# Patient Record
Sex: Female | Born: 1965 | Race: White | Hispanic: No | Marital: Married | Smoking: Former smoker
Health system: Southern US, Community
[De-identification: ages and names within clinical notes are randomized; demographics above are authoritative.]

## PROBLEM LIST (undated history)

## (undated) DIAGNOSIS — Z808 Family history of malignant neoplasm of other organs or systems: Secondary | ICD-10-CM

## (undated) DIAGNOSIS — E785 Hyperlipidemia, unspecified: Secondary | ICD-10-CM

## (undated) DIAGNOSIS — E041 Nontoxic single thyroid nodule: Secondary | ICD-10-CM

## (undated) DIAGNOSIS — Z85828 Personal history of other malignant neoplasm of skin: Secondary | ICD-10-CM

## (undated) DIAGNOSIS — M1990 Unspecified osteoarthritis, unspecified site: Secondary | ICD-10-CM

## (undated) HISTORY — DX: Nontoxic single thyroid nodule: E04.1

## (undated) HISTORY — DX: Family history of malignant neoplasm of other organs or systems: Z80.8

## (undated) HISTORY — DX: Personal history of other malignant neoplasm of skin: Z85.828

## (undated) HISTORY — PX: TONSILLECTOMY AND ADENOIDECTOMY: SHX28

## (undated) HISTORY — DX: Hyperlipidemia, unspecified: E78.5

## (undated) HISTORY — DX: Unspecified osteoarthritis, unspecified site: M19.90

---

## 2007-01-24 ENCOUNTER — Ambulatory Visit (HOSPITAL_COMMUNITY): Admission: RE | Admit: 2007-01-24 | Discharge: 2007-01-24 | Payer: Self-pay | Admitting: Dermatology

## 2008-01-20 IMAGING — CR DG CHEST 2V
2 series · 2 of 2 positions shown · non-contrast
Comparison: none

HISTORY: Malignant melanoma, former smoker

CHEST 2 VIEWS:
No prior exams for comparison.
Normal heart size, mediastinal contours, and pulmonary vascularity.
Minimal peribronchial thickening.
No infiltrate, effusion, or pulmonary mass/nodule.
Bones unremarkable.

[view not recorded (1 of 2)]
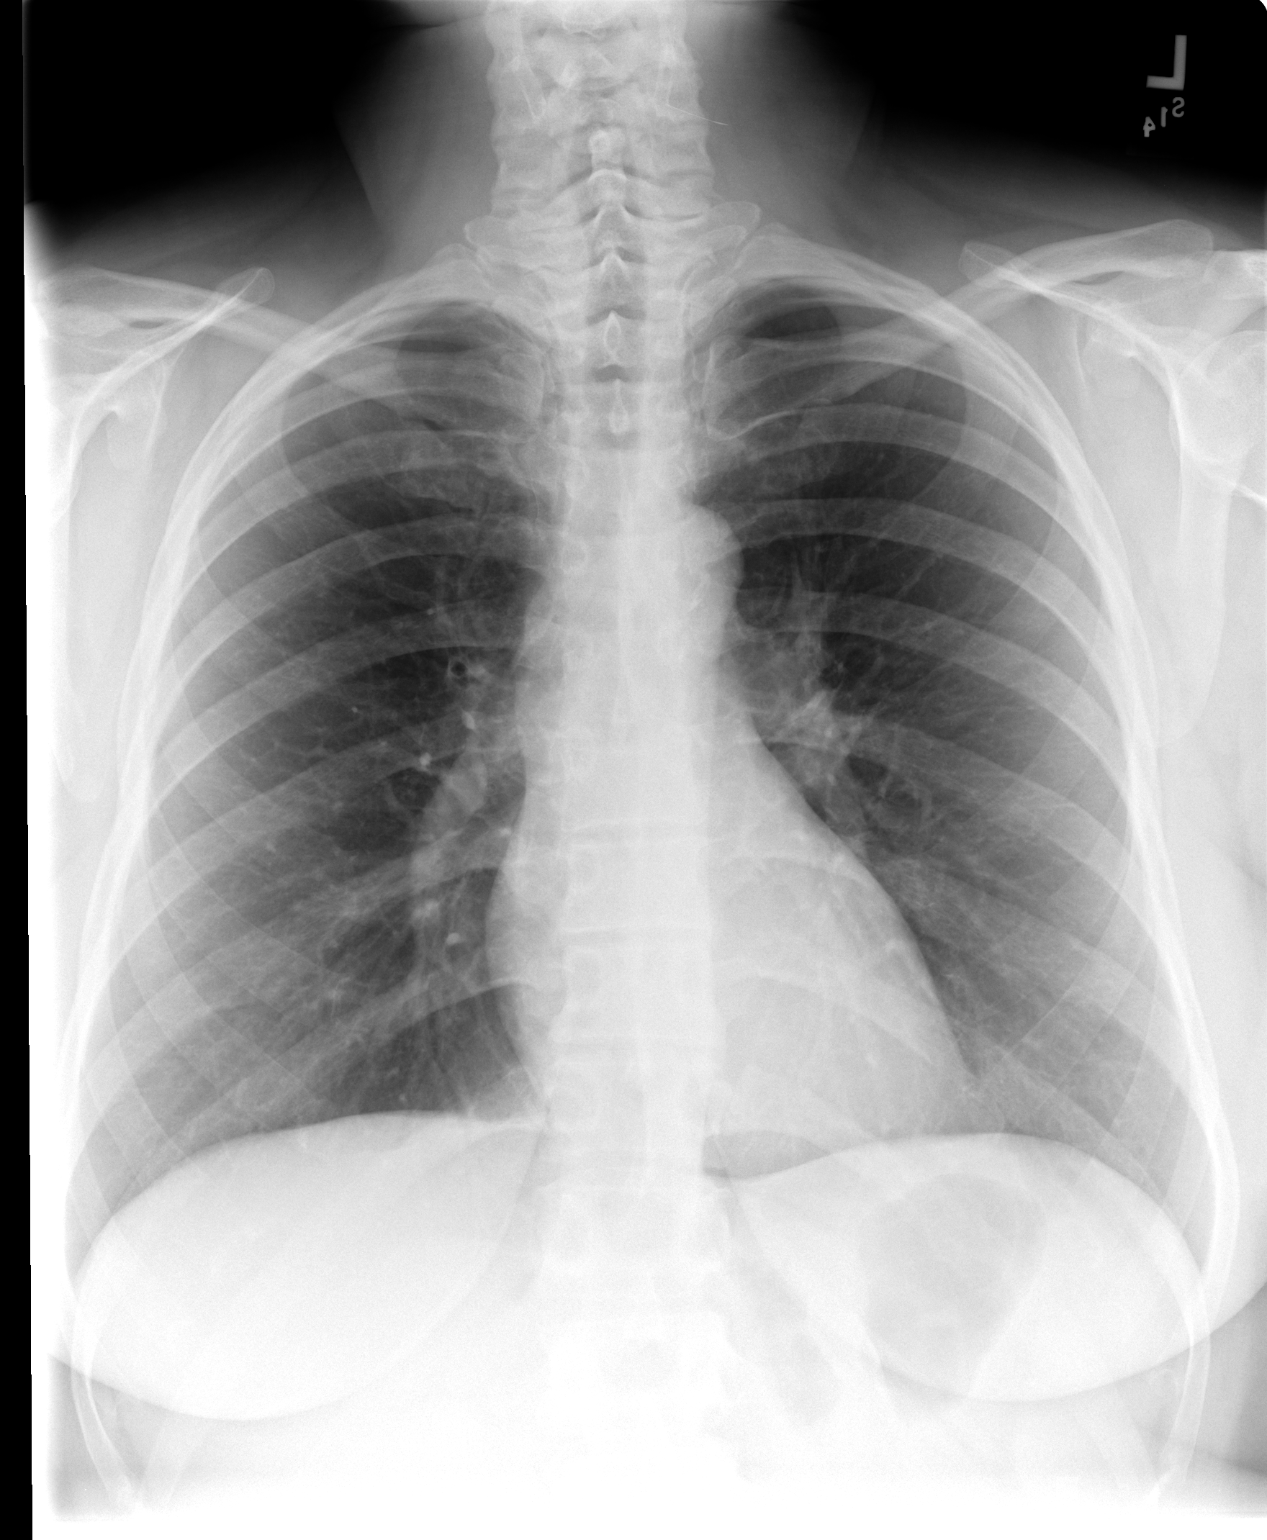

[view not recorded (2 of 2)]
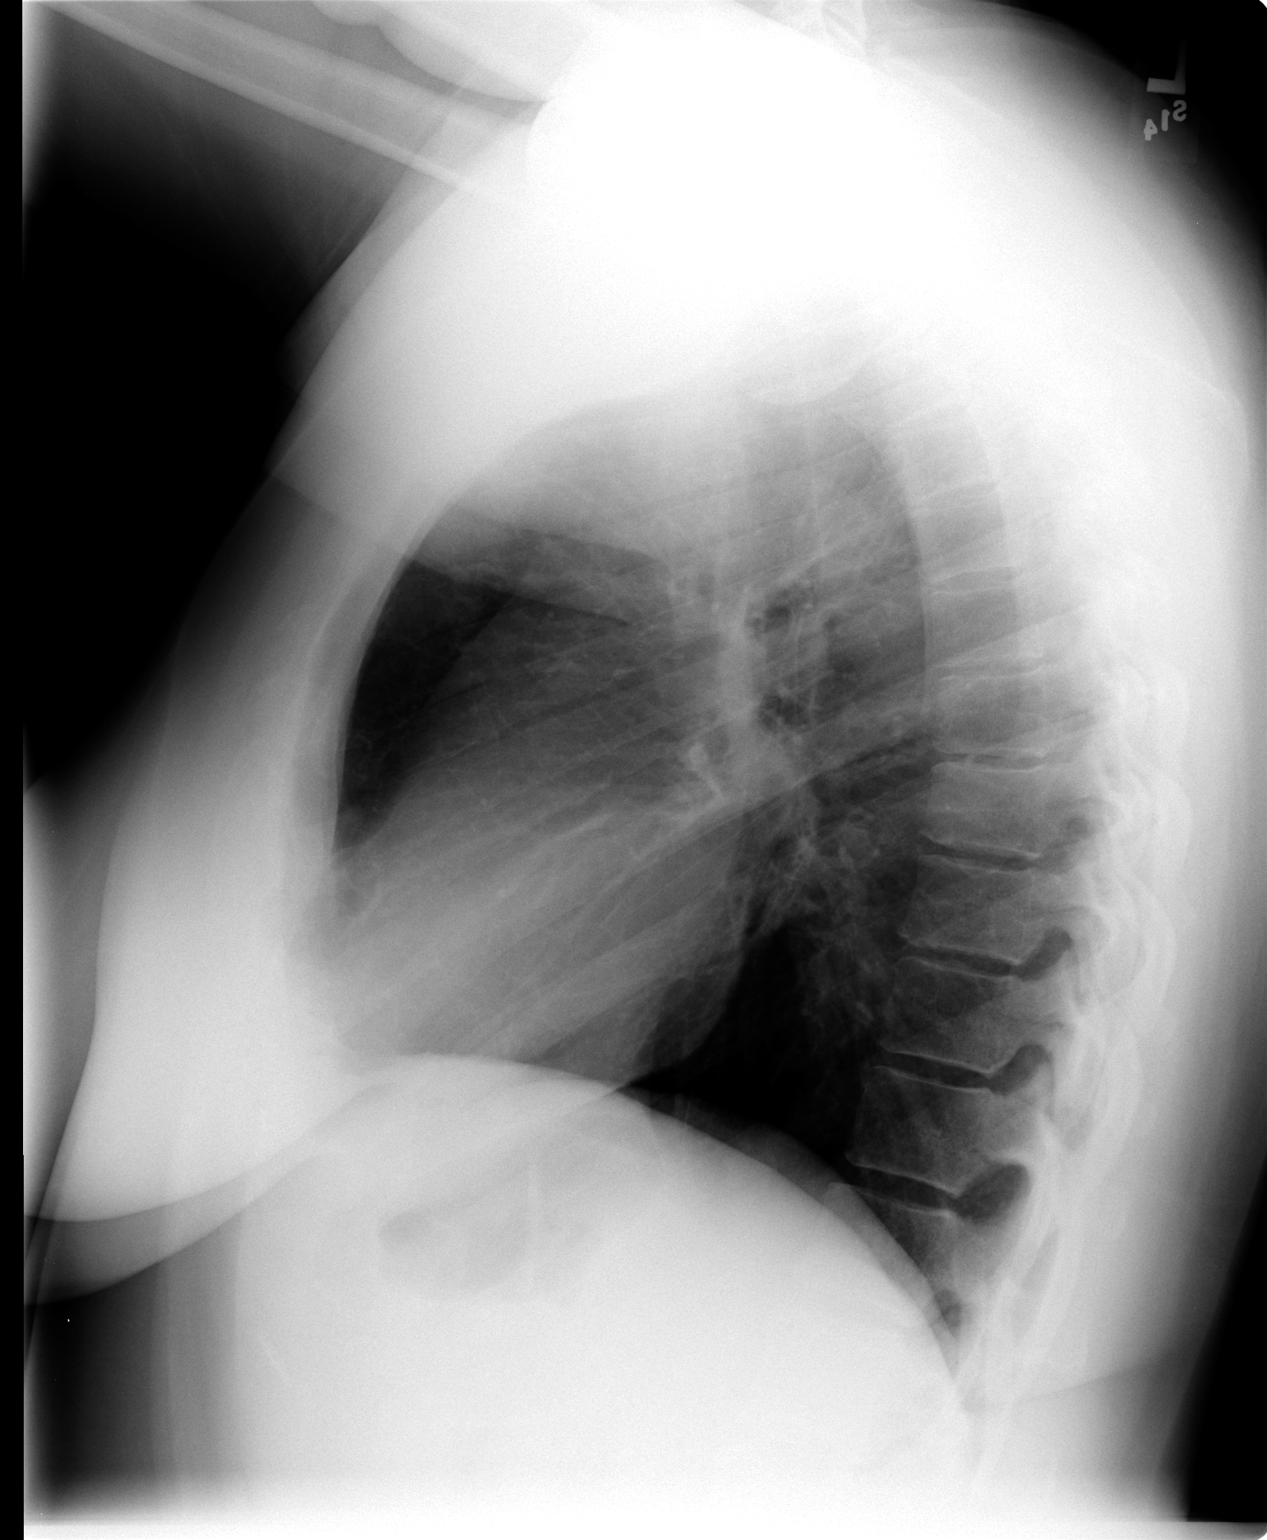

[2 of 2 positions shown; findings below may reference images not displayed]

IMPRESSION: No significant abnormalities.

## 2018-10-04 ENCOUNTER — Ambulatory Visit (INDEPENDENT_AMBULATORY_CARE_PROVIDER_SITE_OTHER): Payer: PRIVATE HEALTH INSURANCE | Admitting: Pulmonary Disease

## 2018-10-04 ENCOUNTER — Encounter: Payer: Self-pay | Admitting: Pulmonary Disease

## 2018-10-04 VITALS — BP 124/72 | HR 63 | Ht 66.0 in | Wt 230.4 lb

## 2018-10-04 DIAGNOSIS — J45909 Unspecified asthma, uncomplicated: Secondary | ICD-10-CM | POA: Diagnosis not present

## 2018-10-04 DIAGNOSIS — J4520 Mild intermittent asthma, uncomplicated: Secondary | ICD-10-CM | POA: Diagnosis not present

## 2018-10-04 DIAGNOSIS — R911 Solitary pulmonary nodule: Secondary | ICD-10-CM | POA: Diagnosis not present

## 2018-10-04 LAB — PULMONARY FUNCTION TEST
FEF 25-75 Post: 3.22 L/sec
FEF 25-75 Pre: 3.47 L/sec
FEF2575-%Change-Post: -7 %
FEF2575-%PRED-PRE: 127 %
FEF2575-%Pred-Post: 118 %
FEV1-%CHANGE-POST: -3 %
FEV1-%PRED-PRE: 101 %
FEV1-%Pred-Post: 98 %
FEV1-POST: 2.85 L
FEV1-PRE: 2.94 L
FEV1FVC-%Change-Post: 2 %
FEV1FVC-%Pred-Pre: 106 %
FEV6-%CHANGE-POST: -5 %
FEV6-%PRED-POST: 92 %
FEV6-%Pred-Pre: 97 %
FEV6-POST: 3.31 L
FEV6-PRE: 3.49 L
FEV6FVC-%PRED-POST: 103 %
FEV6FVC-%PRED-PRE: 103 %
FVC-%CHANGE-POST: -5 %
FVC-%Pred-Post: 89 %
FVC-%Pred-Pre: 94 %
FVC-Post: 3.31 L
FVC-Pre: 3.49 L
POST FEV6/FVC RATIO: 100 %
PRE FEV6/FVC RATIO: 100 %
Post FEV1/FVC ratio: 86 %
Pre FEV1/FVC ratio: 84 %

## 2018-10-04 NOTE — Progress Notes (Signed)
PFT completed today.  

## 2018-10-04 NOTE — Assessment & Plan Note (Signed)
Will obtain CT chest without contrast and compared with prior CTs for change. I discussed possible nonmalignant causes for these nodules

## 2018-10-04 NOTE — Assessment & Plan Note (Signed)
I am not convinced about her diagnosis of asthma.  She has been off Breo for 6 months and has not had an exacerbation.  She has excellent lung function on testing today and no bronchodilator response. I explained to her the significance of methacholine challenge testing. Okay to stay off inhaled steroids for now and use albuterol on an as-needed basis.  We can reassess in 6 months

## 2018-10-04 NOTE — Addendum Note (Signed)
Addended by: Demetrio Lapping E on: 10/04/2018 10:18 AM   Modules accepted: Orders

## 2018-10-04 NOTE — Patient Instructions (Signed)
Spirometry pre-and post. Blood work for allergy/RAST.  CT chest without contrast to follow-up on pulmonary nodules.  Obtain copies of-prior CT scan in 08/2017 And methacholine challenge test from [facility]  Meanwhile stay on albuterol 2 puffs every 6 hours as needed for wheezing. We discussed possibilities including GERD/allergies/environmental exposure as triggers for asthma

## 2018-10-04 NOTE — Progress Notes (Signed)
Subjective:    Patient ID: Kelsey Maxwell, female    DOB: [birth year], 53 y.o.   MRN: [id number]  HPI Chief Complaint  Patient presents with  . Pulm Consult    Self referral for SOB and wheezing. Had a methacholine challenge and was diagnosed with asthma.    53 year old remote smoker presents for a second opinion regarding diagnosis of "asthma" and pulmonary nodules. 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  She smoked about a pack per day for 12 years until she quit in 1994 during her first pregnancy. She developed chest tightness and shortness of breath in 2018.  CT angiogram showed multiple 4 to 5 mm pleural-based nodules.  She underwent a methacholine challenge test and apparently had a 23% drop in her FEV1 on the second level of concentration.  Based on this she was started on Breo which she took for 6 months and her symptoms subsided and she has stopped taking this.  She rarely needs to take her albuterol. She denies childhood history of asthma but does report hypersensitivity to perfumes, odors and bleach. She reports occasional wheezing especially at night.  She had a follow-up CT scan by Dr. Orson Aloe in 08/2017 which showed stable nodules.  ANA test was negative.  She was advised a six-month follow-up scan which she did not follow-up with.  She now presents for second opinion since she had unsatisfactory answers to her questions previously.  She does report mild dyspnea especially in the hot humid weather.  She feels better with the cool air. She has been diagnosed with low vitamin D levels and this is being supplemented. She reports occasional reflux symptoms for which she takes omeprazole she denies significant postnasal drip  Environment-she lives at home with her husband and children and 2 dogs.  Spirometry shows no evidence of airway obstruction with ratio of 84, FEV1 of 101% and FVC of 94%     Past Medical History:  Diagnosis Date  . Arthritis   .  History of skin cancer of unknown type   . Hyperlipidemia   . Thyroid nodule       No Known Allergies  Social History   Socioeconomic History  . Marital status: Married    Spouse name: Not on file  . Number of children: Not on file  . Years of education: Not on file  . Highest education level: Not on file  Occupational History  . Not on file  Social Needs  . Financial resource strain: Not on file  . Food insecurity:    Worry: Not on file    Inability: Not on file  . Transportation needs:    Medical: Not on file    Non-medical: Not on file  Tobacco Use  . Smoking status: Former Games developer  . Smokeless tobacco: Never Used  Substance and Sexual Activity  . Alcohol use: Not on file  . Drug use: Not on file  . Sexual activity: Not on file  Lifestyle  . Physical activity:    Days per week: Not on file    Minutes per session: Not on file  . Stress: Not on file  Relationships  . Social connections:    Talks on phone: Not on file    Gets together: Not on file    Attends religious service: Not on file    Active member of club or organization: Not on file    Attends meetings of clubs or organizations: Not on file  Relationship status: Not on file  . Intimate partner violence:    Fear of current or ex partner: Not on file    Emotionally abused: Not on file    Physically abused: Not on file    Forced sexual activity: Not on file  Other Topics Concern  . Not on file  Social History Narrative  . Not on file      Family History  Problem Relation Age of Onset  . Hyperlipidemia Mother   . COPD Father   . Dementia Father   . Hyperlipidemia Father      Review of Systems Constitutional: negative for anorexia, fevers and sweats  Eyes: negative for irritation, redness and visual disturbance  Ears, nose, mouth, throat, and face: negative for earaches, epistaxis, nasal congestion and sore throat  Respiratory: negative for cough, dyspnea on exertion, sputum and wheezing    Cardiovascular: negative for chest pain, dyspnea, lower extremity edema, orthopnea, palpitations and syncope  Gastrointestinal: negative for abdominal pain, constipation, diarrhea, melena, nausea and vomiting  Genitourinary:negative for dysuria, frequency and hematuria  Hematologic/lymphatic: negative for bleeding, easy bruising and lymphadenopathy  Musculoskeletal:negative for arthralgias, muscle weakness and stiff joints  Neurological: negative for coordination problems, gait problems, headaches and weakness  Endocrine: negative for diabetic symptoms including polydipsia, polyuria and weight loss     Objective:   Physical Exam  Gen. Pleasant, obese, in no distress, normal affect ENT - no pallor,icterus, no post nasal drip, class 2 airway Neck: No JVD, no thyromegaly, no carotid bruits Lungs: no use of accessory muscles, no dullness to percussion, decreased without rales or rhonchi  Cardiovascular: Rhythm regular, heart sounds  normal, no murmurs or gallops, no peripheral edema Abdomen: soft and non-tender, no hepatosplenomegaly, BS normal. Musculoskeletal: No deformities, no cyanosis or clubbing Neuro:  alert, non focal, no tremors        Assessment & Plan:

## 2018-10-05 LAB — RESPIRATORY ALLERGY PROFILE REGION II ~~LOC~~
Allergen, Cedar tree, t12: <0.1 kU/L
Allergen, Mulberry, t76: <0.1 kU/L
Allergen, Oak,t7: <0.1 kU/L
Allergen, P. notatum, m1: <0.1 kU/L
Aspergillus fumigatus, m3: <0.1 kU/L
CLASS: 0
CLASS: 0
CLASS: 0
CLASS: 0
CLASS: 0
CLASS: 0
CLASS: 0
Cat Dander: <0.1 kU/L
Class: 0
Class: 0
Class: 0
Class: 0
Class: 0
Class: 0
Class: 0
Class: 0
Class: 0
Class: 0
Class: 0
Class: 0
Class: 0
Class: 0
Class: 0
Class: 0
Class: 0
Cockroach: <0.1 kU/L
D. farinae: <0.1 kU/L
Dog Dander: <0.1 kU/L
Elm IgE: <0.1 kU/L
IGE (IMMUNOGLOBULIN E), SERUM: 16 kU/L (ref <=114)
Rough Pigweed  IgE: <0.1 kU/L
Timothy Grass: <0.1 kU/L

## 2018-10-05 LAB — INTERPRETATION:

## 2018-10-13 ENCOUNTER — Ambulatory Visit (HOSPITAL_COMMUNITY)
Admission: RE | Admit: 2018-10-13 | Discharge: 2018-10-13 | Disposition: A | Discharge status: Home / Self Care | Payer: PRIVATE HEALTH INSURANCE | Source: Ambulatory Visit | Attending: Pulmonary Disease | Admitting: Pulmonary Disease

## 2018-10-13 DIAGNOSIS — R911 Solitary pulmonary nodule: Secondary | ICD-10-CM

## 2018-10-17 ENCOUNTER — Telehealth: Payer: Self-pay | Admitting: Pulmonary Disease

## 2018-10-17 NOTE — Telephone Encounter (Signed)
Called and spoke with Patient.  CT chest results given.  Understanding stated. Nothing further at this time. 

## 2018-11-02 ENCOUNTER — Encounter: Payer: Self-pay | Admitting: Pulmonary Disease

## 2018-11-02 ENCOUNTER — Ambulatory Visit (INDEPENDENT_AMBULATORY_CARE_PROVIDER_SITE_OTHER): Payer: PRIVATE HEALTH INSURANCE | Admitting: Pulmonary Disease

## 2018-11-02 DIAGNOSIS — R911 Solitary pulmonary nodule: Secondary | ICD-10-CM | POA: Diagnosis not present

## 2018-11-02 DIAGNOSIS — J4520 Mild intermittent asthma, uncomplicated: Secondary | ICD-10-CM

## 2018-11-02 NOTE — Assessment & Plan Note (Signed)
CT scan showed scarring -no follow-up required

## 2018-11-02 NOTE — Assessment & Plan Note (Signed)
Use albuterol only for persistent wheezing or shortness of breath Hold off on inhaled steroids

## 2018-11-02 NOTE — Patient Instructions (Signed)
CT scan showed scarring  Use albuterol only for persistent wheezing or shortness of breath

## 2018-11-02 NOTE — Progress Notes (Signed)
   Subjective:    Patient ID: Kelsey Maxwell, female    DOB: [birth year], 53 y.o.   MRN: [id number]  HPI  53 yo remote smoker for FU  of "asthma" and pulmonary nodules. She smoked about a pack per day for 12 years until she quit in 1994 during her first pregnancy.  She has been off Breo for 6 months, reports occasional wheezing but no associated shortness of breath.  In fact on her last visit she had such a day and her lung function was normal. No asthma "attacks". She reports occasional heartburn for which she takes omeprazole once or twice a month No significant nasal drip currently but does occur in the spring and fall.  We reviewed CT chest which showed mild nodular scarring RAST was negative    Significant tests/ events reviewed  CT angiogram 2018  showed multiple 4 to 5 mm pleural-based nodules.    CT scan 08/2017 which showed stable nodules.  ANA test was negative.    methacholine challenge test 2018  23% drop in her FEV1 on the second level of concentration Spirometry 09/2018 - no evidence of airway obstruction with ratio of 84, FEV1 of 101% and FVC of 94%  Review of Systems Patient denies significant dyspnea,cough, hemoptysis,  chest pain, palpitations, pedal edema, orthopnea, paroxysmal nocturnal dyspnea, lightheadedness, nausea, vomiting, abdominal or  leg pains      Objective:   Physical Exam  Gen. Pleasant, well-nourished, in no distress ENT - no thrush, no pallor/icterus,no post nasal drip Neck: No JVD, no thyromegaly, no carotid bruits Lungs: no use of accessory muscles, no dullness to percussion, clear without rales or rhonchi  Cardiovascular: Rhythm regular, heart sounds  normal, no murmurs or gallops, no peripheral edema Musculoskeletal: No deformities, no cyanosis or clubbing        Assessment & Plan:

## 2019-05-03 ENCOUNTER — Other Ambulatory Visit: Payer: Self-pay

## 2019-05-03 ENCOUNTER — Encounter: Payer: Self-pay | Admitting: Adult Health

## 2019-05-03 ENCOUNTER — Ambulatory Visit (INDEPENDENT_AMBULATORY_CARE_PROVIDER_SITE_OTHER): Payer: PRIVATE HEALTH INSURANCE | Admitting: Adult Health

## 2019-05-03 DIAGNOSIS — J4520 Mild intermittent asthma, uncomplicated: Secondary | ICD-10-CM | POA: Diagnosis not present

## 2019-05-03 DIAGNOSIS — R911 Solitary pulmonary nodule: Secondary | ICD-10-CM

## 2019-05-03 NOTE — Patient Instructions (Addendum)
Albuterol 2 puffs every 4-6 hours as needed wheezing or shortness of breath Add Zyrtec 10mg  At bedtime  .  Continue on Singulair 10mg  At bedtime   Continue on Astelin 1 puff Twice daily  .  Follow-up with Dr. Elsworth Soho in 1 year and as needed

## 2019-05-03 NOTE — Progress Notes (Signed)
 @Patient  ID: Kelsey Maxwell, female    DOB: 1965/12/12, 53 y.o.   MRN: [id number]  Chief Complaint  Patient presents with  . Follow-up    Referring provider: Virgina NorfolkBarker, Wendy L, MD  HPI: 53 year old female former smoker followed for mild intermittent asthma and pulmonary nodules   TEST/EVENTS :  CT angiogram 2018  showed multiple 4 to 5 mm pleural-based nodules.   CT scan 08/2017 which showed stable nodules. ANA test was negative.  CT chest October 13, 2018 showed areas of scarring in the lower lungs bilaterally, flat 4 mm nodular density along the right minor fissure is most compatible with scarring.  No suspicious pulmonary nodules.  methacholine challenge test 2018  23% drop in her FEV1 on the second level of concentration  Spirometry 09/2018 - no evidence of airway obstruction with ratio of 84, FEV1 of 101% and FVC of 94%  05/03/2019 Follow up : Asthma , Lung nodules  Patient returns for a 6636-month follow-up.  Patient has underlying intermittent asthma.  Uses albuterol as needed.  She denies any flare of cough or wheezing.  No increased albuterol use. Does has occasional dry cough .   Patient has been noted to have pulmonary nodules on CT scan.  These have been followed serially over the last 2 years.  Last CT chest October 13, 2018 showed areas of scarring in the lungs bilaterally.  Flat 4 mm nodular density along the right minor fissure is most compatible with scarring.  No suspicious pulmonary nodules were identified.  She denies any hemoptysis or unintentional weight loss.   Recently seen by ENT diagnosed with a right  otitis media started on antibiotic last month. Ear is better. Dx  Taking Astelin and Singulair . Still has some nasal drainage and post nasal drip. Occasional cough and throat clearing  Also diagnosed with Sialadenitis by ENT , currently on antibiotics, warm compresses and lemon drops.    No Known Allergies   There is no immunization history on file for  this patient.  Past Medical History:  Diagnosis Date  . Arthritis   . History of skin cancer of unknown type   . Hyperlipidemia   . Thyroid nodule     Tobacco History: Social History   Tobacco Use  Smoking Status Former Smoker  Smokeless Tobacco Never Used   Counseling given: Not Answered   Outpatient Medications Prior to Visit  Medication Sig Dispense Refill  . Albuterol Sulfate (PROAIR RESPICLICK) 108 (90 Base) MCG/ACT AEPB Inhale 1-2 puffs into the lungs.    . simvastatin (ZOCOR) 40 MG tablet every evening.    . Vitamin D, Ergocalciferol, (DRISDOL) 1.25 MG (50000 UT) CAPS capsule Take 50,000 Units by mouth 2 (two) times a week.     No facility-administered medications prior to visit.      Review of Systems:   Constitutional:   No  weight loss, night sweats,  Fevers, chills, fatigue, or  lassitude.  HEENT:   No headaches,  Difficulty swallowing,  Tooth/dental problems, or  Sore throat,                No sneezing, itching, ear ache, + nasal congestion, post nasal drip,   CV:  No chest pain,  Orthopnea, PND, swelling in lower extremities, anasarca, dizziness, palpitations, syncope.   GI  No heartburn, indigestion, abdominal pain, nausea, vomiting, diarrhea, change in bowel habits, loss of appetite, bloody stools.   Resp: No shortness of breath with exertion or at rest.  No  excess mucus, no productive cough,  No non-productive cough,  No coughing up of blood.  No change in color of mucus.  No wheezing.  No chest wall deformity  Skin: no rash or lesions.  GU: no dysuria, change in color of urine, no urgency or frequency.  No flank pain, no hematuria   MS:  No joint pain or swelling.  No decreased range of motion.  No back pain.    Physical Exam    GEN: A/Ox3; pleasant , NAD, well nourished    HEENT:  [place]/AT,  NOSE-clear, THROAT-clear, no lesions, no postnasal drip or exudate noted. Palpable and tender submandibular gland on left , soft , no redness   NECK:   Supple w/ fair ROM; no JVD; normal carotid impulses w/o bruits; no thyromegaly or nodules palpated; no lymphadenopathy.    RESP  Clear  P & A; w/o, wheezes/ rales/ or rhonchi. no accessory muscle use, no dullness to percussion  CARD:  RRR, no m/r/g, no peripheral edema, pulses intact, no cyanosis or clubbing.  GI:   Soft & nt; nml bowel sounds; no organomegaly or masses detected.   Musco: Warm bil, no deformities or joint swelling noted.   Neuro: alert, no focal deficits noted.    Skin: Warm, no lesions or rashes    Lab Results:  CBC No results found for: WBC, RBC, HGB, HCT, PLT, MCV, MCH, MCHC, RDW, LYMPHSABS, MONOABS, EOSABS, BASOSABS  BMET No results found for: NA, K, CL, CO2, GLUCOSE, BUN, CREATININE, CALCIUM, GFRNONAA, GFRAA  BNP No results found for: BNP  ProBNP No results found for: PROBNP  Imaging: No results found.    PFT Results Latest Ref Rng & Units 10/04/2018  FVC-Pre L 3.49  FVC-Predicted Pre % 94  FVC-Post L 3.31  FVC-Predicted Post % 89  Pre FEV1/FVC % % 84  Post FEV1/FCV % % 86  FEV1-Pre L 2.94  FEV1-Predicted Pre % 101  FEV1-Post L 2.85    No results found for: NITRICOXIDE      Assessment & Plan:   No problem-specific Assessment & Plan notes found for this encounter.     Rexene Edison, NP 05/03/2019

## 2019-05-04 NOTE — Assessment & Plan Note (Signed)
Appears well controlled. Would continue on trigger prevention.  Control for chronic rhinitis symptoms.  We will add Zyrtec to use as needed for postnasal drainage Continue on albuterol as needed Asthma action plan  Plan  Patient Instructions  Albuterol 2 puffs every 4-6 hours as needed wheezing or shortness of breath Add Zyrtec 10mg  At bedtime  .  Continue on Singulair 10mg  At bedtime   Continue on Astelin 1 puff Twice daily  .  Follow-up with Dr. Elsworth Soho in 1 year and as needed

## 2019-05-04 NOTE — Assessment & Plan Note (Signed)
Pulmonary nodules noted on CT scanning have been stable and appear to be scarring on serial CT.  No further scans at this time

## 2020-05-08 ENCOUNTER — Ambulatory Visit: Payer: PRIVATE HEALTH INSURANCE | Admitting: Pulmonary Disease
